# Patient Record
Sex: Female | Born: 1990
Health system: Southern US, Community
[De-identification: ages and names within clinical notes are randomized; demographics above are authoritative.]

## PROBLEM LIST (undated history)

## (undated) HISTORY — PX: KNEE ARTHROSCOPY: SUR90

## (undated) HISTORY — PX: TONSILLECTOMY: SUR1361

---

## 2020-02-19 ENCOUNTER — Other Ambulatory Visit: Payer: Self-pay

## 2020-02-19 ENCOUNTER — Emergency Department
Admission: EM | Admit: 2020-02-19 | Discharge: 2020-02-19 | Disposition: A | Discharge status: Home / Self Care | Payer: Self-pay | Attending: Emergency Medicine | Admitting: Emergency Medicine

## 2020-02-19 ENCOUNTER — Encounter: Payer: Self-pay | Admitting: Emergency Medicine

## 2020-02-19 DIAGNOSIS — B353 Tinea pedis: Secondary | ICD-10-CM | POA: Insufficient documentation

## 2020-02-19 MED ORDER — CLOTRIMAZOLE-BETAMETHASONE 1-0.05 % EX CREA: TOPICAL_CREAM | CUTANEOUS | 1 refills | Status: AC

## 2020-02-19 NOTE — Discharge Instructions (Signed)
Follow-up with [facility] if not improving in 1 to 2 weeks.  Use medication as prescribed.  Also use over-the-counter Tinactin powder in your socks and shoes, wash sheets, towels and socks and a bleach product to kill spores occults athlete's foot

## 2020-02-19 NOTE — ED Provider Notes (Signed)
[facility] Emergency Department Provider Note  ____________________________________________   First MD Initiated Contact with Patient 02/19/20 1014     (approximate)  I have reviewed the triage vital signs and the nursing notes.   HISTORY  Chief Complaint Rash    HPI Makensey Rego is a 29 y.o. female presents emergency department with a rash on the right foot.  Patient states it is very itchy.  She noticed it after going swimming.  She thought it was athlete's foot and used a spray without any relief.  Symptoms ongoing for approximately 1 week.    History reviewed. No pertinent past medical history.  There are no problems to display for this patient.   History reviewed. No pertinent surgical history.  Prior to Admission medications   Medication Sig Start Date End Date Taking? Authorizing Provider  clotrimazole-betamethasone (LOTRISONE) cream Apply to affected area 2 times daily 02/19/20 02/18/21  Sherrie Mustache Roselyn Bering, PA-C    Allergies Amoxicillin  History reviewed. No pertinent family history.  Social History Social History   Tobacco Use  . Smoking status: Not on file  Substance Use Topics  . Alcohol use: Not on file  . Drug use: Not on file    Review of Systems  Constitutional: No fever/chills Eyes: No visual changes. ENT: No sore throat. Respiratory: Denies cough Genitourinary: Negative for dysuria. Musculoskeletal: Negative for back pain. Skin: Positive for rash. Psychiatric: no mood changes,     ____________________________________________   PHYSICAL EXAM:  VITAL SIGNS: ED Triage Vitals  Enc Vitals Group     BP 02/19/20 0940 (!) 104/62     Pulse Rate 02/19/20 0940 89     Resp 02/19/20 0940 20     Temp 02/19/20 0943 98.5 F (36.9 C)     Temp Source 02/19/20 0943 Oral     SpO2 02/19/20 0940 98 %     Weight 02/19/20 0941 134 lb (60.8 kg)     Height 02/19/20 0941 5\' 8"  (1.727 m)     Head Circumference --      Peak Flow  --      Pain Score 02/19/20 0941 0     Pain Loc --      Pain Edu? --      Excl. in GC? --     Constitutional: Alert and oriented. Well appearing and in no acute distress. Eyes: Conjunctivae are normal.  Head: Atraumatic. Nose: No congestion/rhinnorhea. Mouth/Throat: Mucous membranes are moist.   Neck:  supple no lymphadenopathy noted Cardiovascular: Normal rate, regular rhythm.  Respiratory: Normal respiratory effort.  No retractions,  GU: deferred Musculoskeletal: FROM all extremities, warm and well perfused Neurologic:  Normal speech and language.  Skin:  Skin is warm, dry and intact.  Pink based rash with scaling white skin on the top typical of tinea Psychiatric: Mood and affect are normal. Speech and behavior are normal.  ____________________________________________   LABS (all labs ordered are listed, but only abnormal results are displayed)  Labs Reviewed - No data to display ____________________________________________   ____________________________________________  RADIOLOGY    ____________________________________________   PROCEDURES  Procedure(s) performed: No  Procedures    ____________________________________________   INITIAL IMPRESSION / ASSESSMENT AND PLAN / ED COURSE  Pertinent labs & imaging results that were available during my care of the patient were reviewed by me and considered in my medical decision making (see chart for details).   Patient is a 29 year old female presents emergency department with a rash on the right foot.  See HPI physical exam is consistent with tinea  Explained findings to the patient.  Is given prescription for Lotrisone.  She is to use Tinactin powder in her socks and shoes.  Wash socks sheets and towels with a bleach product to kill spores.  She is discharged stable condition      As part of my medical decision making, I reviewed the following data within the electronic MEDICAL RECORD NUMBER Nursing notes reviewed  and incorporated, Old chart reviewed, Notes from prior ED visits and [place] Controlled Substance Database  ____________________________________________   FINAL CLINICAL IMPRESSION(S) / ED DIAGNOSES  Final diagnoses:  Athlete's foot on right      NEW MEDICATIONS STARTED DURING THIS VISIT:  New Prescriptions   CLOTRIMAZOLE-BETAMETHASONE (LOTRISONE) CREAM    Apply to affected area 2 times daily     Note:  This document was prepared using Dragon voice recognition software and may include unintentional dictation errors.    Faythe Ghee, PA-C 02/19/20 1046    Minna Antis, MD 02/19/20 1327

## 2020-02-19 NOTE — ED Triage Notes (Signed)
Pt reports rash on the top of her right foot for a few days.

## 2020-02-19 NOTE — ED Notes (Signed)
See triage note  Presents with rash to right foot  States rash is between toes  Positive itching

## 2020-05-09 ENCOUNTER — Other Ambulatory Visit: Payer: Self-pay

## 2020-05-09 ENCOUNTER — Emergency Department
Admission: EM | Admit: 2020-05-09 | Discharge: 2020-05-09 | Disposition: A | Discharge status: Home / Self Care | Payer: Self-pay | Attending: Emergency Medicine | Admitting: Emergency Medicine

## 2020-05-09 ENCOUNTER — Emergency Department: Payer: Self-pay

## 2020-05-09 DIAGNOSIS — S60222A Contusion of left hand, initial encounter: Secondary | ICD-10-CM | POA: Insufficient documentation

## 2020-05-09 DIAGNOSIS — W010XXA Fall on same level from slipping, tripping and stumbling without subsequent striking against object, initial encounter: Secondary | ICD-10-CM | POA: Insufficient documentation

## 2020-05-09 MED ORDER — MELOXICAM 15 MG PO TABS
15.0000 mg | ORAL_TABLET | Freq: Every day | ORAL | 2 refills | Status: AC
Start: 2020-05-09 — End: 2021-05-09

## 2020-05-09 NOTE — ED Notes (Signed)
Pt unable to sign for d/c d/t hand injury, verbalizes understanding of d/c instructions, denies questions or concerns

## 2020-05-09 NOTE — ED Notes (Signed)
See triage note, pt reports playing football yesterday when caught ball 4 fingers bent backwards. States barely able to move fingers.  No swelling or redness noted.  Ambulatory to treatment room, NAD noted

## 2020-05-09 NOTE — ED Provider Notes (Signed)
[facility] Emergency Department Provider Note  ____________________________________________   First MD Initiated Contact with Patient 05/09/20 1453     (approximate)  I have reviewed the triage vital signs and the nursing notes.   HISTORY  Chief Complaint Hand Pain    HPI Laura Hendricks is a 29 y.o. female presents emergency department for left hand pain.  Stating that she was playing football yesterday in the yard with her son and fell when she caught the ball and bit her 4 fingers backwards.  States having difficulty moving the fingers.  No numbness or tingling.    History reviewed. No pertinent past medical history.  There are no problems to display for this patient.   Past Surgical History:  Procedure Laterality Date  . KNEE ARTHROSCOPY    . TONSILLECTOMY      Prior to Admission medications   Medication Sig Start Date End Date Taking? Authorizing Provider  clotrimazole-betamethasone (LOTRISONE) cream Apply to affected area 2 times daily 02/19/20 02/18/21  Sherrie Mustache, Roselyn Bering, PA-C  meloxicam (MOBIC) 15 MG tablet Take 1 tablet (15 mg total) by mouth daily. 05/09/20 05/09/21  Faythe Ghee, PA-C    Allergies Amoxicillin  No family history on file.  Social History Social History   Tobacco Use  . Smoking status: Never Smoker  . Smokeless tobacco: Never Used  Substance Use Topics  . Alcohol use: Not Currently  . Drug use: Not Currently    Review of Systems  Constitutional: No fever/chills Eyes: No visual changes. ENT: No sore throat. Respiratory: Denies cough Genitourinary: Negative for dysuria. Musculoskeletal: Negative for back pain.  Positive left hand pain Skin: Negative for rash. Psychiatric: no mood changes,     ____________________________________________   PHYSICAL EXAM:  VITAL SIGNS: ED Triage Vitals [05/09/20 1430]  Enc Vitals Group     BP (!) 117/53     Pulse Rate (!) 56     Resp 16     Temp 98.6 F (37 C)      Temp Source Oral     SpO2 100 %     Weight 150 lb (68 kg)     Height 5\' 8"  (1.727 m)     Head Circumference      Peak Flow      Pain Score 7     Pain Loc      Pain Edu?      Excl. in GC?     Constitutional: Alert and oriented. Well appearing and in no acute distress. Eyes: Conjunctivae are normal.  Head: Atraumatic. Nose: No congestion/rhinnorhea. Mouth/Throat: Mucous membranes are moist.   Neck:  supple no lymphadenopathy noted Cardiovascular: Normal rate, regular rhythm.  Respiratory: Normal respiratory effort.  No retractions,  GU: deferred Musculoskeletal: Decreased range of motion of the left fingers.  Left hand is tender along the distal metacarpals and at the snuffbox.  Left wrist is nontender.  Neurovascular is intact neurologic:  Normal speech and language.  Skin:  Skin is warm, dry and intact. No rash noted. Psychiatric: Mood and affect are normal. Speech and behavior are normal.  ____________________________________________   LABS (all labs ordered are listed, but only abnormal results are displayed)  Labs Reviewed - No data to display ____________________________________________   ____________________________________________  RADIOLOGY  X-ray left hand standing  ____________________________________________   PROCEDURES  Procedure(s) performed: Thumb spica brace applied patient   Procedures    ____________________________________________   INITIAL IMPRESSION / ASSESSMENT AND PLAN / ED COURSE  Pertinent  labs & imaging results that were available during my care of the patient were reviewed by me and considered in my medical decision making (see chart for details).   29 year old female presents left hand pain.  See HPI.  Physical exam shows patient have decreased range of motion of the left fingers and snuffbox area is tender.  X-ray of the left hand is negative.  Therefore we placed patient in thumb spica splint.  She is to [facility] with [facility] and see the hand specialist.  Take the meloxicam as prescribed.  Elevate and ice.  Return if worsening.  Discharged in stable condition.     Laura Hendricks was evaluated in Emergency Department on 05/09/2020 for the symptoms described in the history of present illness. She was evaluated in the context of the global COVID-19 pandemic, which necessitated consideration that the patient might be at risk for infection with the SARS-CoV-2 virus that causes COVID-19. Institutional protocols and algorithms that pertain to the evaluation of patients at risk for COVID-19 are in a state of rapid change based on information released by regulatory bodies including the CDC and federal and state organizations. These policies and algorithms were followed during the patient's care in the ED.    As part of my medical decision making, I reviewed the following data within the electronic MEDICAL RECORD NUMBER Nursing notes reviewed and incorporated, Old chart reviewed, Radiograph reviewed , Notes from prior ED visits and [place] Controlled Substance Database  ____________________________________________   FINAL CLINICAL IMPRESSION(S) / ED DIAGNOSES  Final diagnoses:  Contusion of left hand, initial encounter      NEW MEDICATIONS STARTED DURING THIS VISIT:  Discharge Medication List as of 05/09/2020  3:39 PM    START taking these medications   Details  meloxicam (MOBIC) 15 MG tablet Take 1 tablet (15 mg total) by mouth daily., Starting Mon 05/09/2020, Until Tue 05/09/2021, Normal         Note:  This document was prepared using Dragon voice recognition software and may include unintentional dictation errors.    Faythe Ghee, PA-C 05/09/20 1557    Chesley Noon, MD 05/09/20 [phone]

## 2020-05-09 NOTE — Discharge Instructions (Signed)
Follow-up with [facility].  Please call for an appointment.  Elevate and ice the left hand.  Take the meloxicam daily to decrease inflammation.  Return if worsening

## 2020-05-09 NOTE — ED Triage Notes (Signed)
Pt states she tripped and fell yesterday and injured his left hand

## 2020-07-12 ENCOUNTER — Ambulatory Visit (HOSPITAL_COMMUNITY)
Admission: EM | Admit: 2020-07-12 | Discharge: 2020-07-12 | Disposition: A | Discharge status: Home / Self Care | Payer: HRSA Program | Attending: Urgent Care | Admitting: Urgent Care

## 2020-07-12 ENCOUNTER — Encounter (HOSPITAL_COMMUNITY): Payer: Self-pay | Admitting: Emergency Medicine

## 2020-07-12 ENCOUNTER — Other Ambulatory Visit: Payer: Self-pay

## 2020-07-12 DIAGNOSIS — J069 Acute upper respiratory infection, unspecified: Secondary | ICD-10-CM | POA: Diagnosis not present

## 2020-07-12 DIAGNOSIS — R0602 Shortness of breath: Secondary | ICD-10-CM | POA: Diagnosis present

## 2020-07-12 DIAGNOSIS — Z20822 Contact with and (suspected) exposure to covid-19: Secondary | ICD-10-CM | POA: Insufficient documentation

## 2020-07-12 DIAGNOSIS — R0789 Other chest pain: Secondary | ICD-10-CM | POA: Diagnosis present

## 2020-07-12 MED ORDER — PROMETHAZINE-DM 6.25-15 MG/5ML PO SYRP
5.0000 mL | ORAL_SOLUTION | Freq: Every evening | ORAL | 0 refills | Status: DC | PRN
Start: 2020-07-12 — End: 2020-12-22

## 2020-07-12 MED ORDER — BENZONATATE 100 MG PO CAPS: 100.0000 mg | ORAL_CAPSULE | Freq: Three times a day (TID) | ORAL | 0 refills | Status: DC | PRN

## 2020-07-12 MED ORDER — PSEUDOEPHEDRINE HCL 30 MG PO TABS
30.0000 mg | ORAL_TABLET | Freq: Three times a day (TID) | ORAL | 0 refills | Status: AC | PRN
Start: 2020-07-12 — End: ?

## 2020-07-12 MED ORDER — CETIRIZINE HCL 10 MG PO TABS: 10.0000 mg | ORAL_TABLET | Freq: Every day | ORAL | 0 refills | Status: AC

## 2020-07-12 NOTE — ED Triage Notes (Signed)
Symptoms for 3 days.  Cough, fatigue, headache, right back pain, reports fever of 100

## 2020-07-12 NOTE — Discharge Instructions (Addendum)

## 2020-07-12 NOTE — ED Provider Notes (Signed)
[facility]   MRN: [id number] DOB: Dec 12, 1990  Subjective:   Laura Hendricks is a 29 y.o. female presenting for 3 day history of acute onset cough, fatigue, intermittent headaches. Cough is dry, elicits chest pain and right sided back pain, occasional shortness of breath. Denies history of lung disorders. No smoking. No COVID vaccination.   No current facility-administered medications for this encounter.  Current Outpatient Medications:  .  clotrimazole-betamethasone (LOTRISONE) cream, Apply to affected area 2 times daily, Disp: 15 g, Rfl: 1 .  meloxicam (MOBIC) 15 MG tablet, Take 1 tablet (15 mg total) by mouth daily., Disp: 30 tablet, Rfl: 2   Allergies  Allergen Reactions  . Amoxicillin Anaphylaxis    History reviewed. No pertinent past medical history.   Past Surgical History:  Procedure Laterality Date  . KNEE ARTHROSCOPY    . TONSILLECTOMY      Family History  Problem Relation Age of Onset  . CAD Mother     Social History   Tobacco Use  . Smoking status: Never Smoker  . Smokeless tobacco: Never Used  Substance Use Topics  . Alcohol use: Not Currently  . Drug use: Not Currently    ROS   Objective:   Vitals: BP (!) 117/47 (BP Location: Left Arm)   Pulse 98   Temp 98.1 F (36.7 C) (Oral)   Resp 18   LMP 06/18/2020   SpO2 100%   Physical Exam Constitutional:      General: She is not in acute distress.    Appearance: Normal appearance. She is well-developed. She is not ill-appearing, toxic-appearing or diaphoretic.  HENT:     Head: Normocephalic and atraumatic.     Nose: Nose normal.     Mouth/Throat:     Mouth: Mucous membranes are moist.  Eyes:     Extraocular Movements: Extraocular movements intact.     Pupils: Pupils are equal, round, and reactive to light.  Cardiovascular:     Rate and Rhythm: Normal rate and regular rhythm.     Pulses: Normal pulses.     Heart sounds: Normal heart sounds. No murmur heard. No friction  rub. No gallop.   Pulmonary:     Effort: Pulmonary effort is normal. No respiratory distress.     Breath sounds: Normal breath sounds. No stridor. No wheezing, rhonchi or rales.  Skin:    General: Skin is warm and dry.     Findings: No rash.  Neurological:     Mental Status: She is alert and oriented to person, place, and time.     Cranial Nerves: No cranial nerve deficit.     Motor: No weakness.     Coordination: Coordination normal.     Gait: Gait normal.     Deep Tendon Reflexes: Reflexes normal.  Psychiatric:        Mood and Affect: Mood normal.        Behavior: Behavior normal.        Thought Content: Thought content normal.      Assessment and Plan :   PDMP not reviewed this encounter.  1. Viral URI with cough   2. Atypical chest pain   3. Shortness of breath     Will manage for viral illness such as viral URI, viral syndrome, viral rhinitis, COVID-19. Counseled patient on nature of COVID-19 including modes of transmission, diagnostic testing, management and supportive care.  Offered scripts for symptomatic relief. COVID 19 testing is pending. Counseled patient on potential for  adverse effects with medications prescribed/recommended today, ER and return-to-clinic precautions discussed, patient verbalized understanding.     Wallis Bamberg, PA-C 07/13/20 1115

## 2020-07-13 LAB — SARS CORONAVIRUS 2 (TAT 6-24 HRS): SARS Coronavirus 2: NEGATIVE

## 2020-07-29 ENCOUNTER — Ambulatory Visit (HOSPITAL_COMMUNITY): Admit: 2020-07-29 | Discharge status: Absent without leave | Payer: Self-pay

## 2020-07-29 ENCOUNTER — Ambulatory Visit (HOSPITAL_COMMUNITY)
Admission: EM | Admit: 2020-07-29 | Discharge: 2020-07-29 | Disposition: A | Discharge status: Home / Self Care | Payer: HRSA Program | Attending: Internal Medicine | Admitting: Internal Medicine

## 2020-07-29 ENCOUNTER — Other Ambulatory Visit: Payer: Self-pay

## 2020-07-29 ENCOUNTER — Encounter (HOSPITAL_COMMUNITY): Payer: Self-pay | Admitting: Emergency Medicine

## 2020-07-29 DIAGNOSIS — R6889 Other general symptoms and signs: Secondary | ICD-10-CM | POA: Diagnosis present

## 2020-07-29 DIAGNOSIS — U071 COVID-19: Secondary | ICD-10-CM | POA: Diagnosis not present

## 2020-07-29 LAB — RESP PANEL BY RT-PCR (RSV, FLU A&B, COVID)  RVPGX2
Influenza A by PCR: NEGATIVE
Influenza B by PCR: NEGATIVE
Resp Syncytial Virus by PCR: NEGATIVE
SARS Coronavirus 2 by RT PCR: POSITIVE — AB

## 2020-07-29 MED ORDER — ONDANSETRON 4 MG PO TBDP
4.0000 mg | ORAL_TABLET | Freq: Three times a day (TID) | ORAL | 0 refills | Status: DC | PRN
Start: 2020-07-29 — End: 2020-11-03

## 2020-07-29 MED ORDER — BENZONATATE 100 MG PO CAPS
100.0000 mg | ORAL_CAPSULE | Freq: Three times a day (TID) | ORAL | 0 refills | Status: DC
Start: 2020-07-29 — End: 2021-01-10

## 2020-07-29 MED ORDER — IBUPROFEN 600 MG PO TABS
600.0000 mg | ORAL_TABLET | Freq: Four times a day (QID) | ORAL | 0 refills | Status: DC | PRN
Start: 2020-07-29 — End: 2020-11-03

## 2020-07-29 NOTE — ED Triage Notes (Signed)
PT C/O: persistent cold sx onset 3 weeks... seen here on 12/14 for similar sx but now sts sx have worsened  Sx today include body aches, headache, fatigue, fevers  Tested for COVID on 12/14 and it came back negative.   DENIES: v/n/d  TAKING MEDS: OTC cough meds  A&O x4... NAD... Ambulatory

## 2020-07-29 NOTE — Discharge Instructions (Signed)
Please increase oral fluid intake Tylenol/Motrin as needed for pain and/or fever Please quarantine until COVID-19 test is available Return to urgent care if you have any worsening symptoms.

## 2020-07-30 ENCOUNTER — Emergency Department: Payer: HRSA Program

## 2020-07-30 ENCOUNTER — Other Ambulatory Visit: Payer: Self-pay

## 2020-07-30 ENCOUNTER — Emergency Department
Admission: EM | Admit: 2020-07-30 | Discharge: 2020-07-30 | Disposition: A | Discharge status: Home / Self Care | Payer: HRSA Program | Attending: Emergency Medicine | Admitting: Emergency Medicine

## 2020-07-30 DIAGNOSIS — U071 COVID-19: Secondary | ICD-10-CM | POA: Insufficient documentation

## 2020-07-30 DIAGNOSIS — R059 Cough, unspecified: Secondary | ICD-10-CM | POA: Diagnosis present

## 2020-07-30 NOTE — Discharge Instructions (Signed)
Take over-the-counter vitamin C, vitamin D, and zinc Over-the-counter Mucinex for cough if needed Return emergency department if you are feeling short of breath and cannot breathe Take Tylenol and ibuprofen for fever and body aches Drink plenty of fluids Quarantine for 7 to 10 days

## 2020-07-30 NOTE — ED Triage Notes (Signed)
Pt tested positive for covid yesterday. Pt reports she has been having symptoms for 3 weeks. Pt reports weakness, body aches, fevers, pain with inspiration, headaches.   Denies n/v/d, abd pain

## 2020-07-30 NOTE — ED Provider Notes (Signed)
[facility] Emergency Department Provider Note  ____________________________________________   Event Date/Time   First MD Initiated Contact with Patient 07/30/20 1749     (approximate)  I have reviewed the triage vital signs and the nursing notes.   HISTORY  Chief Complaint Generalized Body Aches    HPI Laura Hendricks is a 30 y.o. female presents to the emergency department with URI symptoms.  Is complaining of cough, congestion, fever, chills, denies chest pain, denies shortness of breath unsure close contact with Covid19+ patient, patient is not vaccinated.  Patient had a positive Covid test yesterday   History reviewed. No pertinent past medical history.  There are no problems to display for this patient.   Past Surgical History:  Procedure Laterality Date  . KNEE ARTHROSCOPY    . TONSILLECTOMY      Prior to Admission medications   Medication Sig Start Date End Date Taking? Authorizing Provider  benzonatate (TESSALON) 100 MG capsule Take 1 capsule (100 mg total) by mouth every 8 (eight) hours. 07/29/20   Merrilee Jansky, MD  cetirizine (ZYRTEC ALLERGY) 10 MG tablet Take 1 tablet (10 mg total) by mouth daily. 07/12/20   Wallis Bamberg, PA-C  clotrimazole-betamethasone (LOTRISONE) cream Apply to affected area 2 times daily 02/19/20 02/18/21  Sherrie Mustache, Roselyn Bering, PA-C  ibuprofen (ADVIL) 600 MG tablet Take 1 tablet (600 mg total) by mouth every 6 (six) hours as needed. 07/29/20   Merrilee Jansky, MD  meloxicam (MOBIC) 15 MG tablet Take 1 tablet (15 mg total) by mouth daily. 05/09/20 05/09/21  Marikay Roads, Roselyn Bering, PA-C  ondansetron (ZOFRAN ODT) 4 MG disintegrating tablet Take 1 tablet (4 mg total) by mouth every 8 (eight) hours as needed for nausea or vomiting. 07/29/20   Lamptey, Britta Mccreedy, MD  promethazine-dextromethorphan (PROMETHAZINE-DM) 6.25-15 MG/5ML syrup Take 5 mLs by mouth at bedtime as needed for cough. 07/12/20   Wallis Bamberg, PA-C  pseudoephedrine  (SUDAFED) 30 MG tablet Take 1 tablet (30 mg total) by mouth every 8 (eight) hours as needed for congestion. 07/12/20   Wallis Bamberg, PA-C    Allergies Amoxicillin and Bee venom  Family History  Problem Relation Age of Onset  . CAD Mother     Social History Social History   Tobacco Use  . Smoking status: Never Smoker  . Smokeless tobacco: Never Used  Substance Use Topics  . Alcohol use: Not Currently  . Drug use: Not Currently    Review of Systems  Constitutional: Positive fever/chills Eyes: No visual changes. ENT: Denies sore throat. Respiratory: Positive cough Genitourinary: Negative for dysuria. Musculoskeletal: Negative for back pain. Skin: Negative for rash.    ____________________________________________   PHYSICAL EXAM:  VITAL SIGNS: ED Triage Vitals [07/30/20 1744]  Enc Vitals Group     BP 112/69     Pulse Rate 99     Resp 16     Temp 98.2 F (36.8 C)     Temp Source Oral     SpO2 100 %     Weight 115 lb (52.2 kg)     Height 5\' 8"  (1.727 m)     Head Circumference      Peak Flow      Pain Score      Pain Loc      Pain Edu?      Excl. in GC?     Constitutional: Alert and oriented. Well appearing and in no acute distress. Eyes: Conjunctivae are normal.  Head: Atraumatic. Nose: No  congestion/rhinnorhea. Mouth/Throat: Mucous membranes are moist.   Neck:  supple no lymphadenopathy noted Cardiovascular: Normal rate, regular rhythm. Heart sounds are normal Respiratory: Normal respiratory effort.  No retractions, lungs CTA GU: deferred Musculoskeletal: FROM all extremities, warm and well perfused Neurologic:  Normal speech and language.  Skin:  Skin is warm, dry and intact. No rash noted. Psychiatric: Mood and affect are normal. Speech and behavior are normal.  ____________________________________________   LABS (all labs ordered are listed, but only abnormal results are displayed)  Labs Reviewed - No data to  display ____________________________________________   ____________________________________________  RADIOLOGY  Chest x-ray  ____________________________________________   PROCEDURES  Procedure(s) performed: No  Procedures    ____________________________________________   INITIAL IMPRESSION / ASSESSMENT AND PLAN / ED COURSE  Pertinent labs & imaging results that were available during my care of the patient were reviewed by me and considered in my medical decision making (see chart for details).   Patient is a 30 year old female who complains of URI symptoms.  Patient had a positive Covid test yesterday exam is consistent with covid.    Positive test for covid per labs in chart review  Chest x-ray reviewed by me and confirmed by radiology is negative for any acute abnormality  I did explain to the patient there is nothing we can do for Covid.  At this time she would just need to quarantine and do conservative measures.  Body aches should start to dissipate after few days.  She is to return emergency department she is having difficulty breathing.   The patient was instructed to quarantine themselves at home.  Follow-up with your regular doctor if any concerns.  Return emergency department for worsening. OTC measures discussed     Laura Hendricks was evaluated in Emergency Department on 07/30/2020 for the symptoms described in the history of present illness. She was evaluated in the context of the global COVID-19 pandemic, which necessitated consideration that the patient might be at risk for infection with the SARS-CoV-2 virus that causes COVID-19. Institutional protocols and algorithms that pertain to the evaluation of patients at risk for COVID-19 are in a state of rapid change based on information released by regulatory bodies including the CDC and federal and state organizations. These policies and algorithms were followed during the patient's care in the ED.   As part of my  medical decision making, I reviewed the following data within the electronic MEDICAL RECORD NUMBER Nursing notes reviewed and incorporated, Labs reviewed , Old chart reviewed, Radiograph reviewed , Notes from prior ED visits and [place] Controlled Substance Database  ____________________________________________   FINAL CLINICAL IMPRESSION(S) / ED DIAGNOSES  Final diagnoses:  COVID-19      NEW MEDICATIONS STARTED DURING THIS VISIT:  New Prescriptions   No medications on file     Note:  This document was prepared using Dragon voice recognition software and may include unintentional dictation errors.    Faythe Ghee, PA-C 07/30/20 1832    Sharman Cheek, MD 07/31/20 [phone]

## 2020-07-31 NOTE — ED Provider Notes (Signed)
[facility]    CSN: [id number] Arrival date & time: 07/29/20  1728      History   Chief Complaint Chief Complaint  Patient presents with  . URI    HPI Laura Hendricks is a 30 y.o. female comes to urgent care with cold symptoms of 3 weeks duration.  Patient says that yesterday she started experiencing body aches, headache, fevers and fatigue.  She was tested negative for COVID-19 2 weeks ago.  Patient denies any nausea or vomiting.  No diarrhea.  No loss of taste or smell.  HPI  History reviewed. No pertinent past medical history.  There are no problems to display for this patient.   Past Surgical History:  Procedure Laterality Date  . KNEE ARTHROSCOPY    . TONSILLECTOMY      OB History   No obstetric history on file.      Home Medications    Prior to Admission medications   Medication Sig Start Date End Date Taking? Authorizing Provider  benzonatate (TESSALON) 100 MG capsule Take 1 capsule (100 mg total) by mouth every 8 (eight) hours. 07/29/20  Yes Shivaay Stormont, Britta Mccreedy, MD  ibuprofen (ADVIL) 600 MG tablet Take 1 tablet (600 mg total) by mouth every 6 (six) hours as needed. 07/29/20  Yes Kaeo Jacome, Britta Mccreedy, MD  ondansetron (ZOFRAN ODT) 4 MG disintegrating tablet Take 1 tablet (4 mg total) by mouth every 8 (eight) hours as needed for nausea or vomiting. 07/29/20  Yes Celester Morgan, Britta Mccreedy, MD  cetirizine (ZYRTEC ALLERGY) 10 MG tablet Take 1 tablet (10 mg total) by mouth daily. 07/12/20   Wallis Bamberg, PA-C  clotrimazole-betamethasone (LOTRISONE) cream Apply to affected area 2 times daily 02/19/20 02/18/21  Sherrie Mustache, Roselyn Bering, PA-C  meloxicam (MOBIC) 15 MG tablet Take 1 tablet (15 mg total) by mouth daily. 05/09/20 05/09/21  Fisher, Roselyn Bering, PA-C  promethazine-dextromethorphan (PROMETHAZINE-DM) 6.25-15 MG/5ML syrup Take 5 mLs by mouth at bedtime as needed for cough. 07/12/20   Wallis Bamberg, PA-C  pseudoephedrine (SUDAFED) 30 MG tablet Take 1 tablet (30 mg total) by mouth  every 8 (eight) hours as needed for congestion. 07/12/20   Wallis Bamberg, PA-C    Family History Family History  Problem Relation Age of Onset  . CAD Mother     Social History Social History   Tobacco Use  . Smoking status: Never Smoker  . Smokeless tobacco: Never Used  Substance Use Topics  . Alcohol use: Not Currently  . Drug use: Not Currently     Allergies   Amoxicillin and Bee venom   Review of Systems Review of Systems  Constitutional: Positive for fatigue and fever. Negative for chills.  HENT: Positive for congestion and rhinorrhea. Negative for sore throat.   Respiratory: Negative.   Gastrointestinal: Negative for diarrhea, nausea and vomiting.  Musculoskeletal: Positive for arthralgias and myalgias.  Neurological: Positive for headaches.     Physical Exam Triage Vital Signs ED Triage Vitals  Enc Vitals Group     BP 07/29/20 1810 103/60     Pulse Rate 07/29/20 1810 88     Resp 07/29/20 1810 20     Temp 07/29/20 1810 98.8 F (37.1 C)     Temp Source 07/29/20 1810 Oral     SpO2 07/29/20 1810 99 %     Weight --      Height --      Head Circumference --      Peak Flow --      Pain  Score 07/29/20 1808 10     Pain Loc --      Pain Edu? --      Excl. in GC? --    No data found.  Updated Vital Signs BP 103/60 (BP Location: Right Arm)   Pulse 88   Temp 98.8 F (37.1 C) (Oral)   Resp 20   LMP 07/25/2020   SpO2 99%   Visual Acuity Right Eye Distance:   Left Eye Distance:   Bilateral Distance:    Right Eye Near:   Left Eye Near:    Bilateral Near:     Physical Exam Vitals and nursing note reviewed.  Constitutional:      General: She is not in acute distress.    Appearance: She is ill-appearing.  Cardiovascular:     Rate and Rhythm: Normal rate and regular rhythm.     Pulses: Normal pulses.     Heart sounds: Normal heart sounds.  Pulmonary:     Effort: Pulmonary effort is normal.     Breath sounds: Normal breath sounds.  Neurological:      Mental Status: She is alert.      UC Treatments / Results  Labs (all labs ordered are listed, but only abnormal results are displayed) Labs Reviewed  RESP PANEL BY RT-PCR (RSV, FLU A&B, COVID)  RVPGX2 - Abnormal; Notable for the following components:      Result Value   SARS Coronavirus 2 by RT PCR POSITIVE (*)    All other components within normal limits    EKG   Radiology DG Chest Portable 1 View  Result Date: 07/30/2020 CLINICAL DATA:  Cough COVID EXAM: PORTABLE CHEST 1 VIEW COMPARISON:  None. FINDINGS: The heart size and mediastinal contours are within normal limits. Both lungs are clear. The visualized skeletal structures are unremarkable. IMPRESSION: No active disease. Electronically Signed   By: Jasmine Pang M.D.   On: 07/30/2020 18:19    Procedures Procedures (including critical care time)  Medications Ordered in UC Medications - No data to display  Initial Impression / Assessment and Plan / UC Course  I have reviewed the triage vital signs and the nursing notes.  Pertinent labs & imaging results that were available during my care of the patient were reviewed by me and considered in my medical decision making (see chart for details).     1.  Flulike illness: COVID-19 PCR test done Patient is advised to quarantine until COVID-19 test results are available Tessalon Perles as needed for cough Ibuprofen as needed for pain and/fever Increase oral fluid intake Zofran as needed for nausea We will call patient with recommendations if lab results are abnormal. Final Clinical Impressions(s) / UC Diagnoses   Final diagnoses:  Flu-like symptoms     Discharge Instructions     Please increase oral fluid intake Tylenol/Motrin as needed for pain and/or fever Please quarantine until COVID-19 test is available Return to urgent care if you have any worsening symptoms.   ED Prescriptions    Medication Sig Dispense Auth. Provider   benzonatate (TESSALON) 100 MG  capsule Take 1 capsule (100 mg total) by mouth every 8 (eight) hours. 21 capsule Teyton Pattillo, Britta Mccreedy, MD   ibuprofen (ADVIL) 600 MG tablet Take 1 tablet (600 mg total) by mouth every 6 (six) hours as needed. 30 tablet Niamya Vittitow, Britta Mccreedy, MD   ondansetron (ZOFRAN ODT) 4 MG disintegrating tablet Take 1 tablet (4 mg total) by mouth every 8 (eight) hours as needed for nausea or  vomiting. 20 tablet Latanza Pfefferkorn, Myrene Galas, MD     PDMP not reviewed this encounter.   Chase Picket, MD 07/31/20 [phone]

## 2020-11-03 ENCOUNTER — Ambulatory Visit (HOSPITAL_COMMUNITY): Admission: EM | Admit: 2020-11-03 | Discharge status: Absent without leave | Payer: Self-pay

## 2020-11-03 ENCOUNTER — Ambulatory Visit (HOSPITAL_COMMUNITY)
Admission: EM | Admit: 2020-11-03 | Discharge: 2020-11-03 | Disposition: A | Discharge status: Home / Self Care | Payer: Medicaid Other | Attending: Internal Medicine | Admitting: Internal Medicine

## 2020-11-03 ENCOUNTER — Other Ambulatory Visit: Payer: Self-pay

## 2020-11-03 ENCOUNTER — Encounter (HOSPITAL_COMMUNITY): Payer: Self-pay | Admitting: Emergency Medicine

## 2020-11-03 DIAGNOSIS — J111 Influenza due to unidentified influenza virus with other respiratory manifestations: Secondary | ICD-10-CM

## 2020-11-03 MED ORDER — IBUPROFEN 600 MG PO TABS
600.0000 mg | ORAL_TABLET | Freq: Four times a day (QID) | ORAL | 0 refills | Status: AC | PRN
Start: 2020-11-03 — End: ?

## 2020-11-03 MED ORDER — ONDANSETRON 4 MG PO TBDP
4.0000 mg | ORAL_TABLET | Freq: Three times a day (TID) | ORAL | 0 refills | Status: DC | PRN
Start: 2020-11-03 — End: 2021-01-10

## 2020-11-03 NOTE — ED Provider Notes (Signed)
[facility]    CSN: [id number] Arrival date & time: 11/03/20  1355      History   Chief Complaint Chief Complaint  Patient presents with  . Fever  . Generalized Body Aches  . Fatigue  . Cough  . Nasal Congestion    HPI Laura Hendricks is a 30 y.o. female comes to the urgent care with generalized body aches, nonproductive cough, nasal congestion, fever and fatigue of 6 days duration.  Patient's daughter child was diagnosed with influenza infection about a week or so ago.  Has nausea but no vomiting.  Her appetite is diminished.  She still tolerating oral fluids.  No diarrhea.  No abdominal pain.  No shortness of breath.  Patient endorses some mild intermittent wheezing. History reviewed. No pertinent past medical history.  There are no problems to display for this patient.   Past Surgical History:  Procedure Laterality Date  . KNEE ARTHROSCOPY    . TONSILLECTOMY      OB History   No obstetric history on file.      Home Medications    Prior to Admission medications   Medication Sig Start Date End Date Taking? Authorizing Provider  ibuprofen (ADVIL) 600 MG tablet Take 1 tablet (600 mg total) by mouth every 6 (six) hours as needed. 11/03/20  Yes Dontee Jaso, Britta Mccreedy, MD  ondansetron (ZOFRAN ODT) 4 MG disintegrating tablet Take 1 tablet (4 mg total) by mouth every 8 (eight) hours as needed for nausea or vomiting. 11/03/20  Yes Azrael Huss, Britta Mccreedy, MD  benzonatate (TESSALON) 100 MG capsule Take 1 capsule (100 mg total) by mouth every 8 (eight) hours. 07/29/20   Merrilee Jansky, MD  cetirizine (ZYRTEC ALLERGY) 10 MG tablet Take 1 tablet (10 mg total) by mouth daily. 07/12/20   Wallis Bamberg, PA-C  clotrimazole-betamethasone (LOTRISONE) cream Apply to affected area 2 times daily 02/19/20 02/18/21  Sherrie Mustache, Roselyn Bering, PA-C  meloxicam (MOBIC) 15 MG tablet Take 1 tablet (15 mg total) by mouth daily. 05/09/20 05/09/21  Fisher, Roselyn Bering, PA-C  promethazine-dextromethorphan  (PROMETHAZINE-DM) 6.25-15 MG/5ML syrup Take 5 mLs by mouth at bedtime as needed for cough. 07/12/20   Wallis Bamberg, PA-C  pseudoephedrine (SUDAFED) 30 MG tablet Take 1 tablet (30 mg total) by mouth every 8 (eight) hours as needed for congestion. 07/12/20   Wallis Bamberg, PA-C    Family History Family History  Problem Relation Age of Onset  . CAD Mother     Social History Social History   Tobacco Use  . Smoking status: Never Smoker  . Smokeless tobacco: Never Used  Substance Use Topics  . Alcohol use: Not Currently  . Drug use: Not Currently     Allergies   Amoxicillin and Bee venom   Review of Systems Review of Systems  Constitutional: Positive for chills, fatigue and fever.  HENT: Positive for congestion, rhinorrhea and sore throat.   Respiratory: Positive for cough.   Gastrointestinal: Positive for abdominal pain and nausea. Negative for diarrhea and vomiting.  Musculoskeletal: Positive for arthralgias and myalgias.  Neurological: Positive for headaches.     Physical Exam Triage Vital Signs ED Triage Vitals  Enc Vitals Group     BP 11/03/20 1441 (!) 100/58     Pulse Rate 11/03/20 1441 89     Resp 11/03/20 1441 17     Temp 11/03/20 1441 98.4 F (36.9 C)     Temp Source 11/03/20 1441 Oral     SpO2 11/03/20 1441 98 %  Weight --      Height --      Head Circumference --      Peak Flow --      Pain Score 11/03/20 1439 3     Pain Loc --      Pain Edu? --      Excl. in GC? --    No data found.  Updated Vital Signs BP (!) 100/58 (BP Location: Right Arm)   Pulse 89   Temp 98.4 F (36.9 C) (Oral)   Resp 17   LMP 10/12/2020   SpO2 98%   Visual Acuity Right Eye Distance:   Left Eye Distance:   Bilateral Distance:    Right Eye Near:   Left Eye Near:    Bilateral Near:     Physical Exam Vitals and nursing note reviewed.  Constitutional:      General: She is not in acute distress.    Appearance: She is not ill-appearing.  HENT:     Right Ear:  Tympanic membrane normal.     Left Ear: Tympanic membrane normal.  Cardiovascular:     Rate and Rhythm: Normal rate and regular rhythm.     Pulses: Normal pulses.     Heart sounds: Normal heart sounds.  Pulmonary:     Effort: Pulmonary effort is normal.     Breath sounds: Normal breath sounds.  Abdominal:     General: Bowel sounds are normal.     Palpations: Abdomen is soft.  Neurological:     Mental Status: She is alert.      UC Treatments / Results  Labs (all labs ordered are listed, but only abnormal results are displayed) Labs Reviewed - No data to display  EKG   Radiology No results found.  Procedures Procedures (including critical care time)  Medications Ordered in UC Medications - No data to display  Initial Impression / Assessment and Plan / UC Course  I have reviewed the triage vital signs and the nursing notes.  Pertinent labs & imaging results that were available during my care of the patient were reviewed by me and considered in my medical decision making (see chart for details).    1.  Influenza A infection: Ibuprofen as needed for generalized body aches Zofran as needed for nausea Increase oral fluid intake Patient is out of the window for Tamiflu. If patient develops shortness of breath, purulent cough or persistent chest pain she is advised to return to urgent care to be reevaluated. No indication for chest x-ray at this time. Final Clinical Impressions(s) / UC Diagnoses   Final diagnoses:  Influenza     Discharge Instructions     Please take medications as directed Increase oral fluid intake Return to urgent care if you have worsening symptoms i.e. shortness of breath, cough productive of purulent sputum, persistent fevers and chills.   ED Prescriptions    Medication Sig Dispense Auth. Provider   ondansetron (ZOFRAN ODT) 4 MG disintegrating tablet Take 1 tablet (4 mg total) by mouth every 8 (eight) hours as needed for nausea or  vomiting. 20 tablet Sarim Rothman, Britta Mccreedy, MD   ibuprofen (ADVIL) 600 MG tablet Take 1 tablet (600 mg total) by mouth every 6 (six) hours as needed. 30 tablet Shravya Wickwire, Britta Mccreedy, MD     PDMP not reviewed this encounter.   Merrilee Jansky, MD 11/03/20 [phone]

## 2020-11-03 NOTE — Discharge Instructions (Addendum)
Please take medications as directed Increase oral fluid intake Return to urgent care if you have worsening symptoms i.e. shortness of breath, cough productive of purulent sputum, persistent fevers and chills.

## 2020-11-03 NOTE — ED Triage Notes (Signed)
Pt presents with cough, congestion, fever, bodyaches and fatigue xs 6 days. Last dose of tylenol approx 4 hours ago.

## 2020-12-22 ENCOUNTER — Other Ambulatory Visit: Payer: Self-pay

## 2020-12-22 ENCOUNTER — Ambulatory Visit (HOSPITAL_COMMUNITY)
Admission: EM | Admit: 2020-12-22 | Discharge: 2020-12-22 | Disposition: A | Discharge status: Home / Self Care | Payer: Medicaid Other | Attending: Family Medicine | Admitting: Family Medicine

## 2020-12-22 DIAGNOSIS — J019 Acute sinusitis, unspecified: Secondary | ICD-10-CM | POA: Insufficient documentation

## 2020-12-22 DIAGNOSIS — Z1152 Encounter for screening for COVID-19: Secondary | ICD-10-CM | POA: Insufficient documentation

## 2020-12-22 DIAGNOSIS — R059 Cough, unspecified: Secondary | ICD-10-CM | POA: Insufficient documentation

## 2020-12-22 DIAGNOSIS — K0889 Other specified disorders of teeth and supporting structures: Secondary | ICD-10-CM | POA: Insufficient documentation

## 2020-12-22 DIAGNOSIS — H9203 Otalgia, bilateral: Secondary | ICD-10-CM | POA: Insufficient documentation

## 2020-12-22 MED ORDER — KETOROLAC TROMETHAMINE 60 MG/2ML IM SOLN
60.0000 mg | Freq: Once | INTRAMUSCULAR | Status: AC
  Administered 2020-12-22: 60 mg via INTRAMUSCULAR

## 2020-12-22 MED ORDER — KETOROLAC TROMETHAMINE 60 MG/2ML IM SOLN
INTRAMUSCULAR | Status: AC
  Filled 2020-12-22: qty 2

## 2020-12-22 MED ORDER — DOXYCYCLINE HYCLATE 100 MG PO CAPS: 100.0000 mg | ORAL_CAPSULE | Freq: Two times a day (BID) | ORAL | 0 refills | Status: AC

## 2020-12-22 MED ORDER — ONDANSETRON HCL 4 MG PO TABS
4.0000 mg | ORAL_TABLET | Freq: Four times a day (QID) | ORAL | 0 refills | Status: AC
Start: 2020-12-22 — End: ?

## 2020-12-22 MED ORDER — PROMETHAZINE-DM 6.25-15 MG/5ML PO SYRP
5.0000 mL | ORAL_SOLUTION | Freq: Four times a day (QID) | ORAL | 0 refills | Status: DC | PRN
Start: 2020-12-22 — End: 2021-01-10

## 2020-12-22 NOTE — ED Provider Notes (Signed)
[facility]    CSN: [id number] Arrival date & time: 12/22/20  1756      History   Chief Complaint Chief Complaint  Patient presents with  . URI    HPI Laura Hendricks is a 30 y.o. female.   HPI  Patient presents today with URI symptoms and headache.  She reports she has had a headache, otalgia, dental pain, nausea and fever.  She last had fever earlier today.  She reports she has taken Tylenol and ibuprofen.  She is reports recently being exposed to the flu.  Patient had COVID back in December 2021. No past medical history on file.  There are no problems to display for this patient.   Past Surgical History:  Procedure Laterality Date  . KNEE ARTHROSCOPY    . TONSILLECTOMY      OB History   No obstetric history on file.      Home Medications    Prior to Admission medications   Medication Sig Start Date End Date Taking? Authorizing Provider  benzonatate (TESSALON) 100 MG capsule Take 1 capsule (100 mg total) by mouth every 8 (eight) hours. 07/29/20   Merrilee Jansky, MD  cetirizine (ZYRTEC ALLERGY) 10 MG tablet Take 1 tablet (10 mg total) by mouth daily. 07/12/20   Wallis Bamberg, PA-C  clotrimazole-betamethasone (LOTRISONE) cream Apply to affected area 2 times daily 02/19/20 02/18/21  Sherrie Mustache, Roselyn Bering, PA-C  ibuprofen (ADVIL) 600 MG tablet Take 1 tablet (600 mg total) by mouth every 6 (six) hours as needed. 11/03/20   Merrilee Jansky, MD  meloxicam (MOBIC) 15 MG tablet Take 1 tablet (15 mg total) by mouth daily. 05/09/20 05/09/21  Fisher, Roselyn Bering, PA-C  ondansetron (ZOFRAN ODT) 4 MG disintegrating tablet Take 1 tablet (4 mg total) by mouth every 8 (eight) hours as needed for nausea or vomiting. 11/03/20   Merrilee Jansky, MD  promethazine-dextromethorphan (PROMETHAZINE-DM) 6.25-15 MG/5ML syrup Take 5 mLs by mouth at bedtime as needed for cough. 07/12/20   Wallis Bamberg, PA-C  pseudoephedrine (SUDAFED) 30 MG tablet Take 1 tablet (30 mg total) by mouth every 8 (eight)  hours as needed for congestion. 07/12/20   Wallis Bamberg, PA-C    Family History Family History  Problem Relation Age of Onset  . CAD Mother     Social History Social History   Tobacco Use  . Smoking status: Never Smoker  . Smokeless tobacco: Never Used  Substance Use Topics  . Alcohol use: Not Currently  . Drug use: Not Currently     Allergies   Amoxicillin and Bee venom   Review of Systems Review of Systems Pertinent negatives listed in HPI  Physical Exam Triage Vital Signs ED Triage Vitals  Enc Vitals Group     BP 12/22/20 1859 103/68     Pulse Rate 12/22/20 1859 71     Resp 12/22/20 1859 16     Temp 12/22/20 1859 98.4 F (36.9 C)     Temp Source 12/22/20 1859 Oral     SpO2 12/22/20 1859 98 %     Weight --      Height --      Head Circumference --      Peak Flow --      Pain Score 12/22/20 1902 8     Pain Loc --      Pain Edu? --      Excl. in GC? --    No data found.  Updated Vital Signs BP 103/68 (  BP Location: Left Arm)   Pulse 71   Temp 98.4 F (36.9 C) (Oral)   Resp 16   LMP 12/15/2020   SpO2 98%   Visual Acuity Right Eye Distance:   Left Eye Distance:   Bilateral Distance:    Right Eye Near:   Left Eye Near:    Bilateral Near:     Physical Exam  General Appearance:    Alert, acutely ill-appearing, cooperative, no distress  HENT:   Normocephalic, ears normal, nares mucosal edema with congestion, rhinorrhea, oropharynx clear  Eyes:    PERRL, conjunctiva/corneas clear, EOM's intact       Lungs:     Clear to auscultation bilaterally, respirations unlabored  Heart:    Regular rate and rhythm  Neurologic:   Awake, alert, oriented x 3. No apparent focal neurological           defect.     UC Treatments / Results  Labs (all labs ordered are listed, but only abnormal results are displayed) Labs Reviewed - No data to display  EKG   Radiology No results found.  Procedures Procedures (including critical care time)  Medications  Ordered in UC Medications - No data to display  Initial Impression / Assessment and Plan / UC Course  I have reviewed the triage vital signs and the nursing notes.  Pertinent labs & imaging results that were available during my care of the patient were reviewed by me and considered in my medical decision making (see chart for details).     Acute nonrecurrent sinusitis with otalgia cough and tooth pain covering with doxycycline.  Promethazine for cough. Zofran for nausea.  Final Clinical Impressions(s) / UC Diagnoses   Final diagnoses:  Acute non-recurrent sinusitis, unspecified location  Encounter for screening for COVID-19  Otalgia of both ears  Cough  Tooth pain   Discharge Instructions   None    ED Prescriptions    Medication Sig Dispense Auth. Provider   doxycycline (VIBRAMYCIN) 100 MG capsule Take 1 capsule (100 mg total) by mouth 2 (two) times daily. 20 capsule Bing Neighbors, FNP   promethazine-dextromethorphan (PROMETHAZINE-DM) 6.25-15 MG/5ML syrup Take 5 mLs by mouth 4 (four) times daily as needed for cough. 140 mL Bing Neighbors, FNP   ondansetron (ZOFRAN) 4 MG tablet Take 1 tablet (4 mg total) by mouth every 6 (six) hours. 12 tablet Bing Neighbors, FNP     PDMP not reviewed this encounter.   Bing Neighbors, [place] 12/22/20 2049

## 2020-12-22 NOTE — ED Triage Notes (Signed)
Pt here for cold sx onset 3 days associated w/headache, bilateral ear pain, decreased appetite, loss of gustation, fevers, vomiting, diarrhea  Taking OTC acetaminophen   Was sent home from work d/t sx  A&O X4... NAD.Marland Kitchen. ambulatory

## 2020-12-23 LAB — SARS CORONAVIRUS 2 (TAT 6-24 HRS): SARS Coronavirus 2: NEGATIVE

## 2021-01-10 ENCOUNTER — Other Ambulatory Visit: Payer: Self-pay

## 2021-01-10 ENCOUNTER — Encounter (HOSPITAL_COMMUNITY): Payer: Self-pay

## 2021-01-10 ENCOUNTER — Ambulatory Visit (HOSPITAL_COMMUNITY)
Admission: RE | Admit: 2021-01-10 | Discharge: 2021-01-10 | Disposition: A | Discharge status: Home / Self Care | Payer: Medicaid Other | Source: Ambulatory Visit | Attending: Student | Admitting: Student

## 2021-01-10 VITALS — BP 91/60 | HR 60 | Temp 98.4°F | Resp 16

## 2021-01-10 DIAGNOSIS — Z8616 Personal history of COVID-19: Secondary | ICD-10-CM

## 2021-01-10 DIAGNOSIS — U071 COVID-19: Secondary | ICD-10-CM

## 2021-01-10 DIAGNOSIS — Z7689 Persons encountering health services in other specified circumstances: Secondary | ICD-10-CM

## 2021-01-10 MED ORDER — PROMETHAZINE-DM 6.25-15 MG/5ML PO SYRP
5.0000 mL | ORAL_SOLUTION | Freq: Four times a day (QID) | ORAL | 0 refills | Status: AC | PRN
Start: 2021-01-10 — End: ?

## 2021-01-10 MED ORDER — ONDANSETRON 8 MG PO TBDP: 8.0000 mg | ORAL_TABLET | Freq: Three times a day (TID) | ORAL | 0 refills | Status: AC | PRN

## 2021-01-10 NOTE — ED Provider Notes (Signed)
[facility]    CSN: [id number] Arrival date & time: 01/10/21  1744      History   Chief Complaint Chief Complaint  Patient presents with   COVID +    HPI Laura Hendricks is a 30 y.o. female presenting with viral symptoms following testing positive for covid 5 days ago at home.  States this is her third time having COVID.  Endorses productive cough, nasal congestion, generalized body aches, loss of taste and smell.  Nausea without vomiting or diarrhea.  Generalized crampy abdominal pain.  Fever as high as 101 at home earlier today, last dose of antipyretic was 12 hours ago.  Denies chest pain, dizziness, shortness of breath, weakness.  HPI  History reviewed. No pertinent past medical history.  There are no problems to display for this patient.   Past Surgical History:  Procedure Laterality Date   KNEE ARTHROSCOPY     TONSILLECTOMY      OB History   No obstetric history on file.      Home Medications    Prior to Admission medications   Medication Sig Start Date End Date Taking? Authorizing Provider  ondansetron (ZOFRAN ODT) 8 MG disintegrating tablet Take 1 tablet (8 mg total) by mouth every 8 (eight) hours as needed for nausea or vomiting. 01/10/21  Yes Rhys Martini, PA-C  promethazine-dextromethorphan (PROMETHAZINE-DM) 6.25-15 MG/5ML syrup Take 5 mLs by mouth 4 (four) times daily as needed for cough. 01/10/21  Yes Rhys Martini, PA-C  cetirizine (ZYRTEC ALLERGY) 10 MG tablet Take 1 tablet (10 mg total) by mouth daily. 07/12/20   Wallis Bamberg, PA-C  clotrimazole-betamethasone (LOTRISONE) cream Apply to affected area 2 times daily 02/19/20 02/18/21  Sherrie Mustache, Roselyn Bering, PA-C  doxycycline (VIBRAMYCIN) 100 MG capsule Take 1 capsule (100 mg total) by mouth 2 (two) times daily. 12/22/20   Bing Neighbors, FNP  ibuprofen (ADVIL) 600 MG tablet Take 1 tablet (600 mg total) by mouth every 6 (six) hours as needed. 11/03/20   Merrilee Jansky, MD  meloxicam (MOBIC) 15 MG  tablet Take 1 tablet (15 mg total) by mouth daily. 05/09/20 05/09/21  Fisher, Roselyn Bering, PA-C  ondansetron (ZOFRAN) 4 MG tablet Take 1 tablet (4 mg total) by mouth every 6 (six) hours. 12/22/20   Bing Neighbors, FNP  pseudoephedrine (SUDAFED) 30 MG tablet Take 1 tablet (30 mg total) by mouth every 8 (eight) hours as needed for congestion. 07/12/20   Wallis Bamberg, PA-C    Family History Family History  Problem Relation Age of Onset   CAD Mother     Social History Social History   Tobacco Use   Smoking status: Never   Smokeless tobacco: Never  Substance Use Topics   Alcohol use: Not Currently   Drug use: Not Currently     Allergies   Amoxicillin and Bee venom   Review of Systems Review of Systems  Constitutional:  Positive for chills and fever. Negative for appetite change.  HENT:  Positive for congestion. Negative for ear pain, rhinorrhea, sinus pressure, sinus pain and sore throat.   Eyes:  Negative for redness and visual disturbance.  Respiratory:  Positive for cough. Negative for chest tightness, shortness of breath and wheezing.   Cardiovascular:  Negative for chest pain and palpitations.  Gastrointestinal:  Negative for abdominal pain, constipation, diarrhea, nausea and vomiting.  Genitourinary:  Negative for dysuria, frequency and urgency.  Musculoskeletal:  Negative for myalgias.  Neurological:  Negative for dizziness, weakness and headaches.  Psychiatric/Behavioral:  Negative for confusion.   All other systems reviewed and are negative.   Physical Exam Triage Vital Signs ED Triage Vitals  Enc Vitals Group     BP 01/10/21 1903 91/60     Pulse Rate 01/10/21 1903 60     Resp 01/10/21 1903 16     Temp 01/10/21 1903 98.4 F (36.9 C)     Temp Source 01/10/21 1903 Oral     SpO2 01/10/21 1903 100 %     Weight --      Height --      Head Circumference --      Peak Flow --      Pain Score 01/10/21 1904 10     Pain Loc --      Pain Edu? --      Excl. in GC? --     No data found.  Updated Vital Signs BP 91/60 (BP Location: Right Arm)   Pulse 60   Temp 98.4 F (36.9 C) (Oral)   Resp 16   LMP 01/10/2021   SpO2 100%   Visual Acuity Right Eye Distance:   Left Eye Distance:   Bilateral Distance:    Right Eye Near:   Left Eye Near:    Bilateral Near:     Physical Exam Vitals reviewed.  Constitutional:      General: She is not in acute distress.    Appearance: Normal appearance. She is not ill-appearing.  HENT:     Head: Normocephalic and atraumatic.     Right Ear: Hearing, tympanic membrane, ear canal and external ear normal. No swelling or tenderness. There is no impacted cerumen. No mastoid tenderness. Tympanic membrane is not perforated, erythematous, retracted or bulging.     Left Ear: Hearing, tympanic membrane, ear canal and external ear normal. No swelling or tenderness. There is no impacted cerumen. No mastoid tenderness. Tympanic membrane is not perforated, erythematous, retracted or bulging.     Nose:     Right Sinus: No maxillary sinus tenderness or frontal sinus tenderness.     Left Sinus: No maxillary sinus tenderness or frontal sinus tenderness.     Mouth/Throat:     Mouth: Mucous membranes are moist.     Pharynx: Uvula midline. No oropharyngeal exudate or posterior oropharyngeal erythema.     Tonsils: No tonsillar exudate.  Cardiovascular:     Rate and Rhythm: Normal rate and regular rhythm.     Heart sounds: Normal heart sounds.  Pulmonary:     Breath sounds: Normal breath sounds and air entry. No wheezing, rhonchi or rales.     Comments: Occ cough Chest:     Chest wall: No tenderness.  Abdominal:     General: Abdomen is flat. Bowel sounds are normal.     Tenderness: There is no abdominal tenderness. There is no guarding or rebound.  Lymphadenopathy:     Cervical: No cervical adenopathy.  Neurological:     General: No focal deficit present.     Mental Status: She is alert and oriented to person, place, and time.   Psychiatric:        Attention and Perception: Attention and perception normal.        Mood and Affect: Mood and affect normal.        Behavior: Behavior normal. Behavior is cooperative.        Thought Content: Thought content normal.        Judgment: Judgment normal.     UC Treatments / Results  Labs (all labs ordered are listed, but only abnormal results are displayed) Labs Reviewed - No data to display  EKG   Radiology No results found.  Procedures Procedures (including critical care time)  Medications Ordered in UC Medications - No data to display  Initial Impression / Assessment and Plan / UC Course  I have reviewed the triage vital signs and the nursing notes.  Pertinent labs & imaging results that were available during my care of the patient were reviewed by me and considered in my medical decision making (see chart for details).     This patient is a 30 year old female presenting with positive home COVID test. Today this pt is afebrile nontachycardic nontachypneic, oxygenating well on room air, no wheezes rhonchi or rales.  Last dose of antipyretic was 12 hours ago.  Currently on her period, states she is not pregnant.  Promethazine DM, Zofran sent as below.  ED return precautions discussed.  note provided as she has a court date tomorrow.  Final Clinical Impressions(s) / UC Diagnoses   Final diagnoses:  COVID-19  History of COVID-19  Return to work evaluation     Discharge Instructions      -Promethazine DM cough syrup for congestion/cough. This could make you drowsy, so take at night before bed. -Take the Zofran (ondansetron) up to 3 times daily for nausea and vomiting. Dissolve one pill under your tongue or between your teeth and your cheek. -With a virus, you're typically contagious for 5-7 days, or as long as you're having fevers.  -For fevers/chills, bodyaches, headaches- Take Tylenol 1000 mg 3 times daily, and ibuprofen 800 mg 3 times daily with  food.  You can take these together, or alternate every 3-4 hours. -Seek additional medical attention if you develop new symptoms like dizziness, shortness of breath, weakness.     ED Prescriptions     Medication Sig Dispense Auth. Provider   promethazine-dextromethorphan (PROMETHAZINE-DM) 6.25-15 MG/5ML syrup Take 5 mLs by mouth 4 (four) times daily as needed for cough. 118 mL Ignacia Bayley E, PA-C   ondansetron (ZOFRAN ODT) 8 MG disintegrating tablet Take 1 tablet (8 mg total) by mouth every 8 (eight) hours as needed for nausea or vomiting. 20 tablet Rhys Martini, PA-C      PDMP not reviewed this encounter.   Rhys Martini, PA-C 01/10/21 2030

## 2021-01-10 NOTE — Discharge Instructions (Addendum)
-  Promethazine DM cough syrup for congestion/cough. This could make you drowsy, so take at night before bed. -Take the Zofran (ondansetron) up to 3 times daily for nausea and vomiting. Dissolve one pill under your tongue or between your teeth and your cheek. -With a virus, you're typically contagious for 5-7 days, or as long as you're having fevers.  -For fevers/chills, bodyaches, headaches- Take Tylenol 1000 mg 3 times daily, and ibuprofen 800 mg 3 times daily with food.  You can take these together, or alternate every 3-4 hours. -Seek additional medical attention if you develop new symptoms like dizziness, shortness of breath, weakness.

## 2021-01-10 NOTE — ED Triage Notes (Signed)
Patient presents to [facility] for evaluation of cough, nasal congestion, body aches, loss of taste x 3 days.  Patient states she tested positive on a home test last night

## 2021-05-18 IMAGING — DX DG HAND COMPLETE 3+V*L*
3 series · 3 of 3 positions shown · non-contrast
Comparison: None.

CLINICAL DATA: Hand pain, fall, bruising to the third through fifth
metacarpophalangeal joints

EXAM:
LEFT HAND - COMPLETE 3+ VIEW

[hand ap]
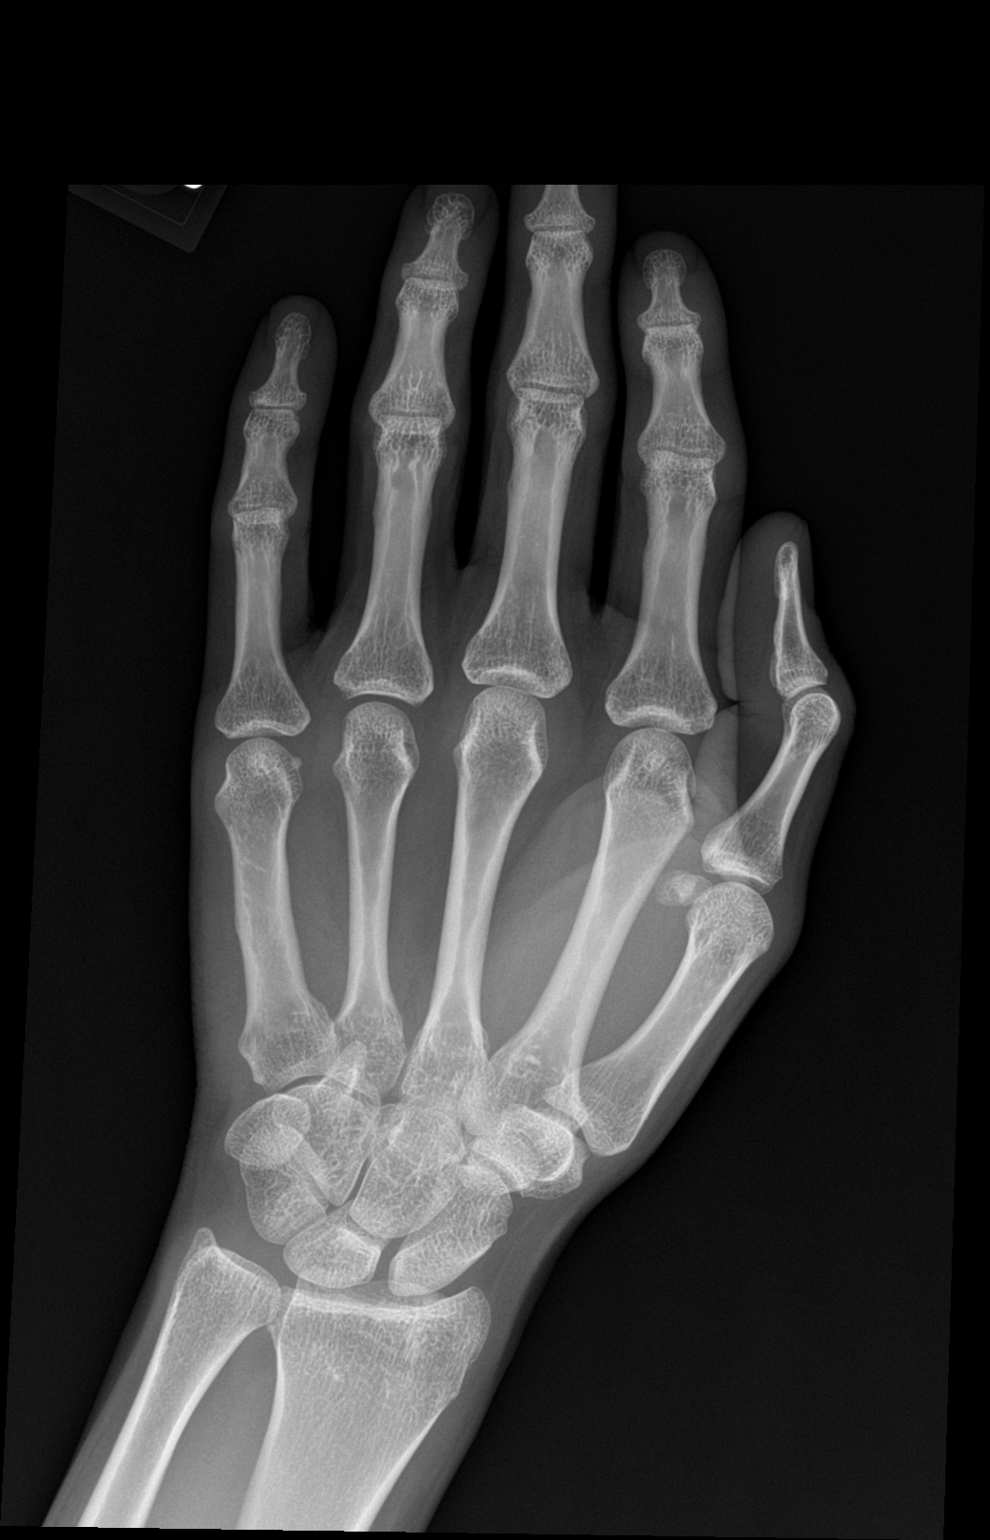

[hand obl]
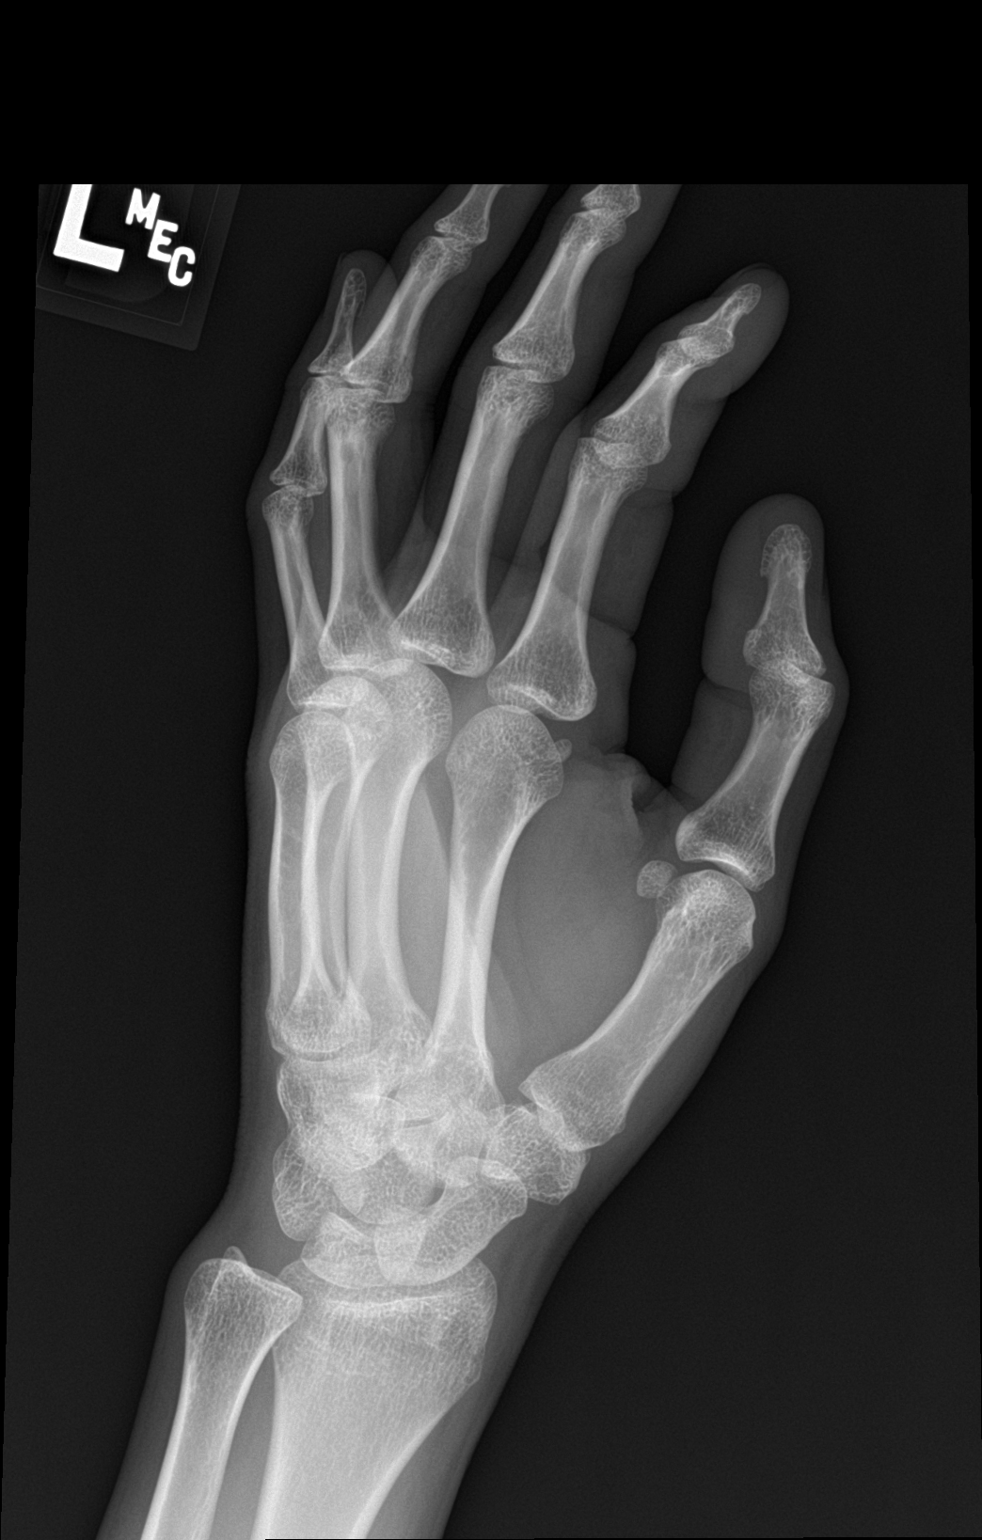

[hand lat]
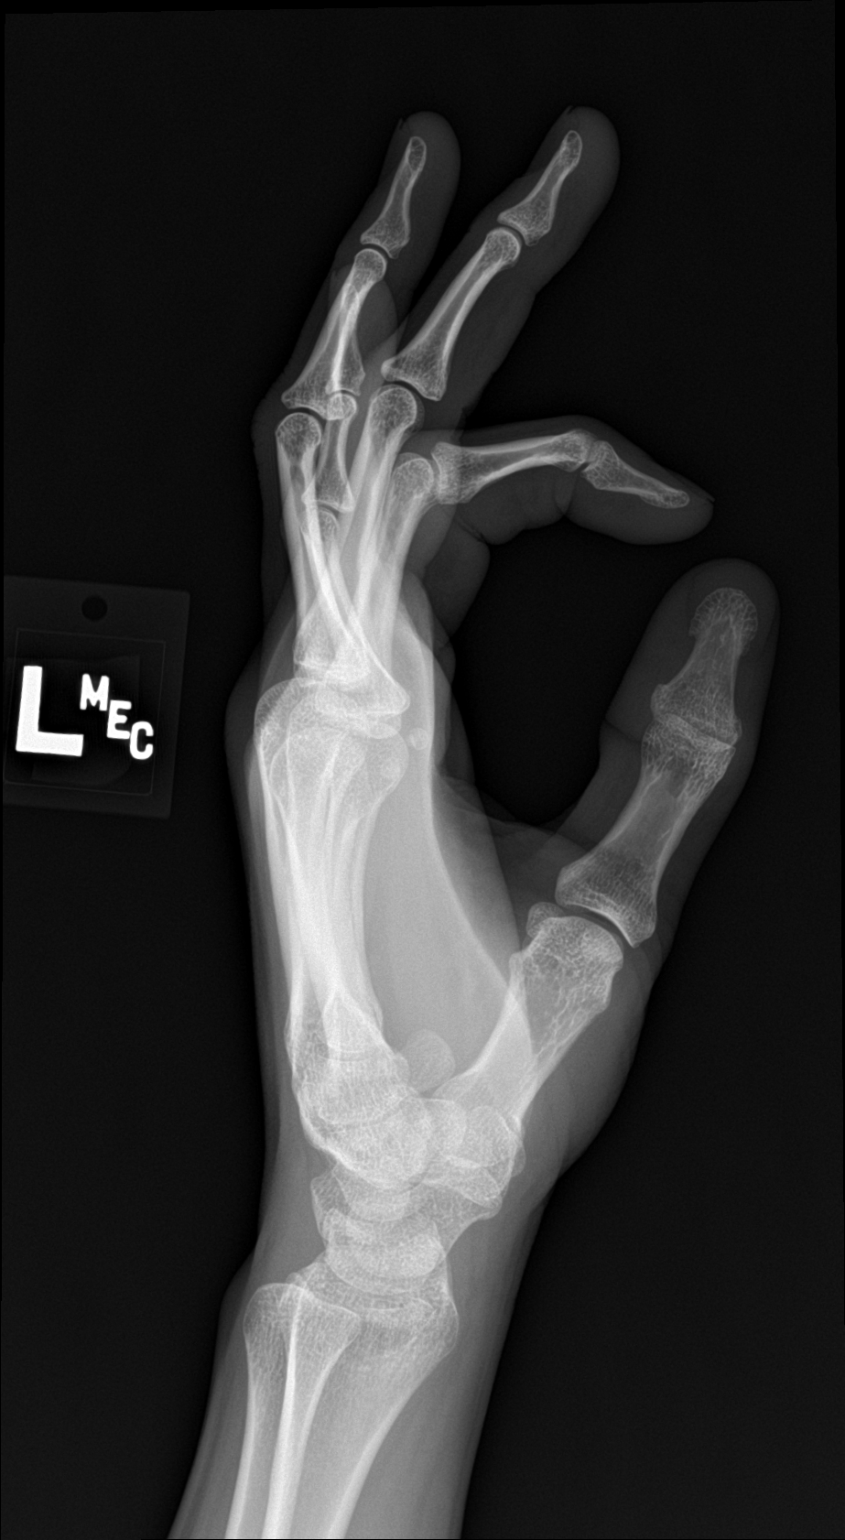

[3 of 3 positions shown; findings below may reference images not displayed]

FINDINGS: There is some mild distal dorsal swelling at the level of the
metacarpal heads/metacarpophalangeal joints. No soft tissue gas or
foreign body. No acute bony abnormality. Specifically, no fracture,
subluxation, or dislocation.
IMPRESSION: Mild swelling at the level of the metacarpal
heads/metacarpophalangeal joints without acute osseous abnormality.

## 2021-08-08 IMAGING — DX DG CHEST 1V PORT
1 series · 1 of 1 positions shown · non-contrast
Comparison: None.

CLINICAL DATA: Cough COVID

EXAM:
PORTABLE CHEST 1 VIEW

[chest ap]
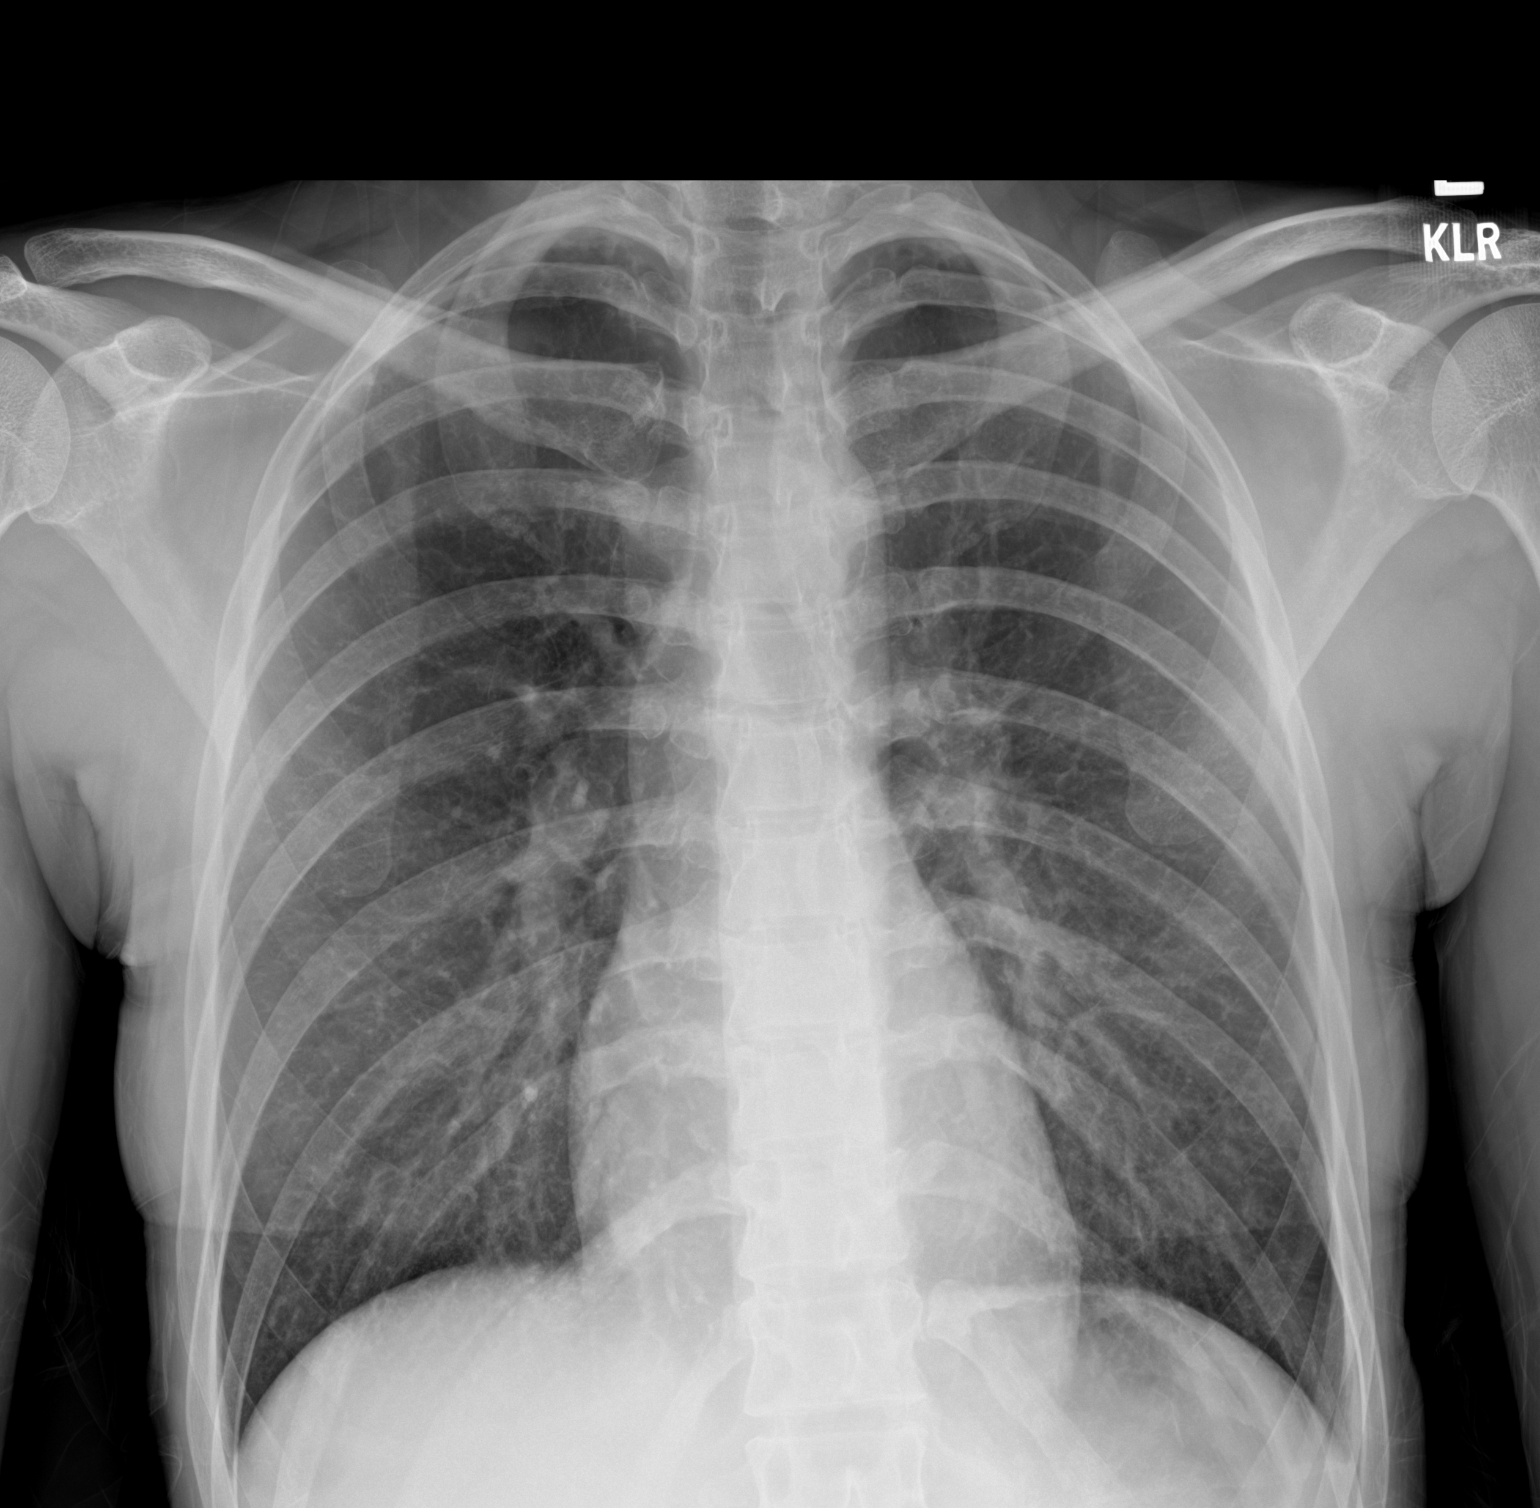

[1 of 1 positions shown; findings below may reference images not displayed]

FINDINGS: The heart size and mediastinal contours are within normal limits.
Both lungs are clear. The visualized skeletal structures are
unremarkable.
IMPRESSION: No active disease.

## 2022-07-15 DIAGNOSIS — R6883 Chills (without fever): Secondary | ICD-10-CM | POA: Diagnosis not present

## 2022-07-15 DIAGNOSIS — R5383 Other fatigue: Secondary | ICD-10-CM | POA: Diagnosis not present

## 2022-07-15 DIAGNOSIS — R112 Nausea with vomiting, unspecified: Secondary | ICD-10-CM | POA: Diagnosis not present

## 2022-07-15 DIAGNOSIS — K529 Noninfective gastroenteritis and colitis, unspecified: Secondary | ICD-10-CM | POA: Diagnosis not present
